# Patient Record
Sex: Male | Born: 1935 | Race: White | Hispanic: No | Marital: Single | Smoking: Never smoker
Health system: Southern US, Community
[De-identification: ages and names within clinical notes are randomized; demographics above are authoritative.]

## PROBLEM LIST (undated history)

## (undated) HISTORY — PX: HERNIA REPAIR: SHX51

---

## 2011-06-17 ENCOUNTER — Encounter (INDEPENDENT_AMBULATORY_CARE_PROVIDER_SITE_OTHER): Payer: Medicare Other | Admitting: Ophthalmology

## 2011-06-17 DIAGNOSIS — H35359 Cystoid macular degeneration, unspecified eye: Secondary | ICD-10-CM

## 2011-06-17 DIAGNOSIS — H3530 Unspecified macular degeneration: Secondary | ICD-10-CM

## 2011-06-17 DIAGNOSIS — H35039 Hypertensive retinopathy, unspecified eye: Secondary | ICD-10-CM

## 2011-06-17 DIAGNOSIS — H43819 Vitreous degeneration, unspecified eye: Secondary | ICD-10-CM

## 2011-08-19 ENCOUNTER — Encounter (INDEPENDENT_AMBULATORY_CARE_PROVIDER_SITE_OTHER): Payer: Medicare Other | Admitting: Ophthalmology

## 2018-04-19 ENCOUNTER — Emergency Department: Payer: Medicare Other

## 2018-04-19 ENCOUNTER — Other Ambulatory Visit: Payer: Self-pay

## 2018-04-19 ENCOUNTER — Emergency Department
Admission: EM | Admit: 2018-04-19 | Discharge: 2018-04-19 | Disposition: A | Discharge status: Home / Self Care | Payer: Medicare Other | Attending: Emergency Medicine | Admitting: Emergency Medicine

## 2018-04-19 DIAGNOSIS — W010XXA Fall on same level from slipping, tripping and stumbling without subsequent striking against object, initial encounter: Secondary | ICD-10-CM | POA: Insufficient documentation

## 2018-04-19 DIAGNOSIS — Y999 Unspecified external cause status: Secondary | ICD-10-CM | POA: Diagnosis not present

## 2018-04-19 DIAGNOSIS — S6992XA Unspecified injury of left wrist, hand and finger(s), initial encounter: Secondary | ICD-10-CM | POA: Diagnosis present

## 2018-04-19 DIAGNOSIS — S63502A Unspecified sprain of left wrist, initial encounter: Secondary | ICD-10-CM | POA: Insufficient documentation

## 2018-04-19 DIAGNOSIS — S0990XA Unspecified injury of head, initial encounter: Secondary | ICD-10-CM | POA: Insufficient documentation

## 2018-04-19 DIAGNOSIS — Y9301 Activity, walking, marching and hiking: Secondary | ICD-10-CM | POA: Diagnosis not present

## 2018-04-19 DIAGNOSIS — W19XXXA Unspecified fall, initial encounter: Secondary | ICD-10-CM

## 2018-04-19 DIAGNOSIS — Y929 Unspecified place or not applicable: Secondary | ICD-10-CM | POA: Insufficient documentation

## 2018-04-19 DIAGNOSIS — S60222A Contusion of left hand, initial encounter: Secondary | ICD-10-CM | POA: Insufficient documentation

## 2018-04-19 LAB — URINALYSIS, COMPLETE (UACMP) WITH MICROSCOPIC
BACTERIA UA: NONE SEEN
BILIRUBIN URINE: NEGATIVE
Glucose, UA: NEGATIVE mg/dL
Hgb urine dipstick: NEGATIVE
KETONES UR: NEGATIVE mg/dL
Leukocytes, UA: NEGATIVE
Nitrite: NEGATIVE
PROTEIN: NEGATIVE mg/dL
Specific Gravity, Urine: 1.024 (ref 1.005–1.030)
pH: 5 (ref 5.0–8.0)

## 2018-04-19 LAB — CBC WITH DIFFERENTIAL/PLATELET
BASOS ABS: 0.1 10*3/uL (ref 0–0.1)
Basophils Relative: 1 %
EOS ABS: 0.1 10*3/uL (ref 0–0.7)
Eosinophils Relative: 1 %
HEMATOCRIT: 46 % (ref 40.0–52.0)
HEMOGLOBIN: 15.8 g/dL (ref 13.0–18.0)
Lymphocytes Relative: 21 %
Lymphs Abs: 2.1 10*3/uL (ref 1.0–3.6)
MCH: 31.1 pg (ref 26.0–34.0)
MCHC: 34.3 g/dL (ref 32.0–36.0)
MCV: 90.7 fL (ref 80.0–100.0)
MONOS PCT: 10 %
Monocytes Absolute: 1 10*3/uL (ref 0.2–1.0)
NEUTROS ABS: 6.6 10*3/uL — AB (ref 1.4–6.5)
NEUTROS PCT: 67 %
Platelets: 232 10*3/uL (ref 150–440)
RBC: 5.07 MIL/uL (ref 4.40–5.90)
RDW: 13.3 % (ref 11.5–14.5)
WBC: 9.8 10*3/uL (ref 3.8–10.6)

## 2018-04-19 LAB — COMPREHENSIVE METABOLIC PANEL
ALBUMIN: 3.8 g/dL (ref 3.5–5.0)
ALK PHOS: 56 U/L (ref 38–126)
ALT: 20 U/L (ref 0–44)
ANION GAP: 9 (ref 5–15)
AST: 25 U/L (ref 15–41)
BUN: 25 mg/dL — AB (ref 8–23)
CO2: 28 mmol/L (ref 22–32)
Calcium: 9.2 mg/dL (ref 8.9–10.3)
Chloride: 101 mmol/L (ref 98–111)
Creatinine, Ser: 1.41 mg/dL — ABNORMAL HIGH (ref 0.61–1.24)
GFR calc Af Amer: 52 mL/min — ABNORMAL LOW (ref >60)
GFR calc non Af Amer: 44 mL/min — ABNORMAL LOW (ref >60)
GLUCOSE: 111 mg/dL — AB (ref 70–99)
POTASSIUM: 3.4 mmol/L — AB (ref 3.5–5.1)
SODIUM: 138 mmol/L (ref 135–145)
Total Bilirubin: 0.9 mg/dL (ref 0.3–1.2)
Total Protein: 8.3 g/dL — ABNORMAL HIGH (ref 6.5–8.1)

## 2018-04-19 MED ORDER — TRAMADOL HCL 50 MG PO TABS: 50.0000 mg | ORAL_TABLET | Freq: Two times a day (BID) | ORAL | 0 refills | Status: AC | PRN

## 2018-04-19 NOTE — Discharge Instructions (Addendum)
Follow[facility]up with the [facility].  Call tomorrow to make arrangements to be seen.  Ice and elevate your hand frequently to reduce swelling.  Wear Ace wrap for support and protection.  You may need to loosen this up if it starts feeling tight.  Tramadol is for pain if you need something for moderate to severe pain.  This medication could cause drowsiness and increase your risk for falling.  Do not take this medication if you plan on walking.  Consider using a walker or a cane when out walking for added support.  Return to the emergency department if any severe worsening of your symptoms.  Continue to hydrate yourself.  Avoid heat and humidity at this time.

## 2018-04-19 NOTE — ED Notes (Signed)
Young male in with pt (caregiver?) states pt fell 2x this week. The 2nd fall he caught himself with his left hand. Now has left hand swelling and pain x 4 days.

## 2018-04-19 NOTE — ED Provider Notes (Signed)
[facility] Emergency Department Provider Note  ____________________________________________   First MD Initiated Contact with Patient 04/19/18 1108     (approximate)  I have reviewed the triage vital signs and the nursing notes.   HISTORY  Chief Complaint Wrist Pain  HPI Dominic Huff is a 84 y.o. male is here with complaint of left hand and wrist pain.  Patient states he fell 4 days ago while walking to the store to purchase bread and water.  He states after he fell he continued walking and again walked back from the store to his home.  Patient lives alone and states that he did not have any food to eat.  He is continued to be ambulatory since that time.  He denies any known loss of consciousness but states that he did hit the left side of his head when he fell.  Patient states that he has tried to stay hydrated because of the hot weather.  He denies any dizziness, headache, chest pain, shortness of breath nausea, vomiting, abdominal pain.  He is brought today by his youngest daughter.  Rates his hand pain as a 9 out of 10.  History reviewed. No pertinent past medical history.  There are no active problems to display for this patient.   Past Surgical History:  Procedure Laterality Date  . HERNIA REPAIR      Prior to Admission medications   Medication Sig Start Date End Date Taking? Authorizing Provider  traMADol (ULTRAM) 50 MG tablet Take 1 tablet (50 mg total) by mouth every 12 (twelve) hours as needed for moderate pain. 04/19/18   Tommi Rumps, PA-C    Allergies Patient has no known allergies.  History reviewed. No pertinent family history.  Social History Social History   Tobacco Use  . Smoking status: Never Smoker  Substance Use Topics  . Alcohol use: Never    Frequency: Never  . Drug use: Not on file    Review of Systems Constitutional: No fever/chills Eyes: No visual changes. ENT: No trauma. Cardiovascular: Denies chest  pain. Respiratory: Denies shortness of breath. Gastrointestinal: No abdominal pain.  No nausea, no vomiting.  No diarrhea.  No constipation. Genitourinary: Negative for dysuria. Musculoskeletal: Negative for back pain.  Positive for left hand and wrist pain. Skin: Negative for rash. Neurological: Negative for headaches, focal weakness or numbness. ____________________________________________   PHYSICAL EXAM:  VITAL SIGNS: ED Triage Vitals  Enc Vitals Group     BP 04/19/18 1033 139/79     Pulse Rate 04/19/18 1033 94     Resp 04/19/18 1033 18     Temp 04/19/18 1033 98.2 F (36.8 C)     Temp Source 04/19/18 1033 Oral     SpO2 04/19/18 1033 98 %     Weight 04/19/18 1040 218 lb (98.9 kg)     Height 04/19/18 1040 6' (1.829 m)     Head Circumference --      Peak Flow --      Pain Score 04/19/18 1039 9     Pain Loc --      Pain Edu? --      Excl. in GC? --     Constitutional: Alert and oriented. Well appearing and in no acute distress.  Patient is very pleasant and talkative. Eyes: Conjunctivae are normal. PERRL. EOMI. Head: Atraumatic. Nose: No congestion/rhinnorhea. Neck: No stridor.  No cervical tenderness on palpation posteriorly.  No evidence of skin abrasion or ecchymosis. Cardiovascular: Normal rate, regular rhythm.  Grossly normal heart sounds.  Good peripheral circulation. Respiratory: Normal respiratory effort.  No retractions. Lungs CTAB. Gastrointestinal: Soft and nontender. No distention.  No CVA tenderness. Musculoskeletal: Examination of left wrist and hand there is moderate soft tissue edema present.  There is diffuse tenderness on palpation dorsal aspect.  Patient is able to move all digits without any difficulty and motor sensory function intact.  Capillary refill is less than 3 seconds.  Skin is intact.  Range of motion of the left wrist is slightly restricted secondary to discomfort.  There is tenderness on palpation of the distal ulna and radius.  Nontender to  palpation forearm and elbow.  Patient denies any pain or discomfort with palpation of the lower extremities including the hip.  Nontender thoracic and lumbar spine to palpation and no evidence of ecchymosis or abrasions seen. Neurologic:  Normal speech and language. No gross focal neurologic deficits are appreciated. No gait instability. Skin:  Skin is warm, dry and intact. No rash noted. Psychiatric: Mood and affect are normal. Speech and behavior are normal.  ____________________________________________   LABS (all labs ordered are listed, but only abnormal results are displayed)  Labs Reviewed  URINALYSIS, COMPLETE (UACMP) WITH MICROSCOPIC - Abnormal; Notable for the following components:      Result Value   Color, Urine AMBER (*)    APPearance HAZY (*)    All other components within normal limits  CBC WITH DIFFERENTIAL/PLATELET - Abnormal; Notable for the following components:   Neutro Abs 6.6 (*)    All other components within normal limits  COMPREHENSIVE METABOLIC PANEL - Abnormal; Notable for the following components:   Potassium 3.4 (*)    Glucose, Bld 111 (*)    BUN 25 (*)    Creatinine, Ser 1.41 (*)    Total Protein 8.3 (*)    GFR calc non Af Amer 44 (*)    GFR calc Af Amer 52 (*)    All other components within normal limits     RADIOLOGY  ED MD interpretation:   X-ray left wrist and hand showed osteoarthritis changes but no acute fracture.  Official radiology report(s): Dg Wrist Complete Left  Result Date: 04/19/2018 CLINICAL DATA:  Fall.  Soft tissue swelling EXAM: LEFT WRIST - COMPLETE 3+ VIEW COMPARISON:  None. FINDINGS: Normal alignment and no fracture. Chondrocalcinosis and mild degenerative change in the wrist joint. Degenerative change in navicular triquetrum joint. No erosion IMPRESSION: Negative for fracture. Triangular fibrocartilage chondrocalcinosis Electronically Signed   By: Marlan Palau M.D.   On: 04/19/2018 12:07   Ct Head Wo Contrast  Result  Date: 04/19/2018 CLINICAL DATA:  S/p fall 4 days ago. EXAM: CT HEAD WITHOUT CONTRAST CT CERVICAL SPINE WITHOUT CONTRAST TECHNIQUE: Multidetector CT imaging of the head and cervical spine was performed following the standard protocol without intravenous contrast. Multiplanar CT image reconstructions of the cervical spine were also generated. COMPARISON:  None FINDINGS: CT HEAD FINDINGS Brain: There are bilateral frontal subdural hygromas, measuring 9 millimeters on the RIGHT and 8 millimeters on the LEFT. These are associated mild mass effect on the frontal lobe gyri bilaterally. No midline shift. No evidence for acute hemorrhage. There is minimal periventricular white matter change. There is no mass lesion. The basilar cisterns and ventricles have a normal appearance. There is no CT evidence for acute infarction or hemorrhage. Vascular: No hyperdense vessel or unexpected calcification. Skull: No acute calvarial injury. There is deformity of the RIGHT mandibular condyle, favored to represent remote injury. Given the lack  of prior studies, is difficult to entirely exclude an acute component superimposed on chronic changes. Sinuses/Orbits: Minimal opacity within the RIGHT ethmoid air cells. Orbits are unremarkable. Mastoid air cells are normally aerated. Other: None CT CERVICAL SPINE FINDINGS Alignment: There is loss of cervical lordosis. This may be secondary to splinting, soft tissue injury, or positioning. Otherwise alignment is normal. Skull base and vertebrae: No acute fracture. No primary bone lesion or focal pathologic process. Soft tissues and spinal canal: No prevertebral fluid or swelling. No visible canal hematoma. Disc levels: There are degenerative changes particularly in the mid cervical spine. Disc height loss particularly at C6-7. Upper chest: Negative. Other: None IMPRESSION: 1. 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 2. No acute hemorrhage. 3. Deformity of  the RIGHT mandibular condyles. Unless the patient has recent injury in this region, the findings are felt to be chronic. 4. Minimal sinus disease. 5. Mid cervical degenerative changes. No evidence for acute cervical spine abnormality. Electronically Signed   By: Norva PavlovElizabeth  Brown M.D.   On: 04/19/2018 13:26   Ct Cervical Spine Wo Contrast  Result Date: 04/19/2018 CLINICAL DATA:  S/p fall 4 days ago. EXAM: CT HEAD WITHOUT CONTRAST CT CERVICAL SPINE WITHOUT CONTRAST TECHNIQUE: Multidetector CT imaging of the head and cervical spine was performed following the standard protocol without intravenous contrast. Multiplanar CT image reconstructions of the cervical spine were also generated. COMPARISON:  None FINDINGS: CT HEAD FINDINGS Brain: There are bilateral frontal subdural hygromas, measuring 9 millimeters on the RIGHT and 8 millimeters on the LEFT. These are associated mild mass effect on the frontal lobe gyri bilaterally. No midline shift. No evidence for acute hemorrhage. There is minimal periventricular white matter change. There is no mass lesion. The basilar cisterns and ventricles have a normal appearance. There is no CT evidence for acute infarction or hemorrhage. Vascular: No hyperdense vessel or unexpected calcification. Skull: No acute calvarial injury. There is deformity of the RIGHT mandibular condyle, favored to represent remote injury. Given the lack of prior studies, is difficult to entirely exclude an acute component superimposed on chronic changes. Sinuses/Orbits: Minimal opacity within the RIGHT ethmoid air cells. Orbits are unremarkable. Mastoid air cells are normally aerated. Other: None CT CERVICAL SPINE FINDINGS Alignment: There is loss of cervical lordosis. This may be secondary to splinting, soft tissue injury, or positioning. Otherwise alignment is normal. Skull base and vertebrae: No acute fracture. No primary bone lesion or focal pathologic process. Soft tissues and spinal canal: No  prevertebral fluid or swelling. No visible canal hematoma. Disc levels: There are degenerative changes particularly in the mid cervical spine. Disc height loss particularly at C6-7. Upper chest: Negative. Other: None IMPRESSION: 1. 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 2. No acute hemorrhage. 3. Deformity of the RIGHT mandibular condyles. Unless the patient has recent injury in this region, the findings are felt to be chronic. 4. Minimal sinus disease. 5. Mid cervical degenerative changes. No evidence for acute cervical spine abnormality. Electronically Signed   By: Norva PavlovElizabeth  Brown M.D.   On: 04/19/2018 13:26   Dg Hand Complete Left  Result Date: 04/19/2018 CLINICAL DATA:  Fall.  Swelling EXAM: LEFT HAND - COMPLETE 3+ VIEW COMPARISON:  None. FINDINGS: Negative for acute fracture.  Dorsal soft tissue swelling. Degenerative change in the wrist joint with chondrocalcinosis. Degenerative change in the navicular triquetrum. No erosion IMPRESSION: Negative for fracture Dorsal soft tissue swelling Degenerative changes  in the wrist with chondrocalcinosis of the triangular fibrocartilage. Electronically Signed   By: Marlan Palau M.D.   On: 04/19/2018 12:06  ____________________________________________   PROCEDURES  Procedure(s) performed: None  Procedures  Critical Care performed: No  ____________________________________________   INITIAL IMPRESSION / ASSESSMENT AND PLAN / ED COURSE  As part of my medical decision making, I reviewed the following data within the electronic MEDICAL RECORD NUMBER Notes from prior ED visits and [place] Controlled Substance Database  Ring was removed from patient's left fourth finger prior to discharge.  Lab findings was discussed with patient and daughter.  Patient currently takes no medication and has no documented health problems.  Patient states that he is seen at the [facility] in [place].  His next appointment is in October.  He  agrees to call and make an earlier appointment for follow-up.  Attempts were made to talk with a neurosurgeon at [facility] however there was no one listed on call for this weekend.  This was discussed with Dr. Lenard Lance.  Patient has remained alert, oriented, talkative and very pleasant.  Daughter acknowledges that the information that he is given is accurate.  Patient is felt to be neurologically intact.  Patient was stable and agrees to follow-up with his doctors at the [place].  A prescription for tramadol was written for 1 every 12 hours if needed for hand and wrist pain.  He is to ice and elevate to reduce swelling.  He is aware that the pain medication could cause drowsiness and increase his risk for falling.  We also discussed using a walker or cane when he is walking.  ____________________________________________   FINAL CLINICAL IMPRESSION(S) / ED DIAGNOSES  Final diagnoses:  Sprain of left wrist, initial encounter  Contusion of left hand, initial encounter  Fall, initial encounter     ED Discharge Orders        Ordered    traMADol (ULTRAM) 50 MG tablet  Every 12 hours PRN     04/19/18 1523       Note:  This document was prepared using Dragon voice recognition software and may include unintentional dictation errors.    Tommi Rumps, PA-C 04/19/18 1829    Jeanmarie Plant, MD 04/24/18 [phone]

## 2018-04-19 NOTE — ED Triage Notes (Signed)
Pt fell 4 days ago and injured L wrist. L hand and wrist are swollen. Able to wiggle fingers. Cap refill less than 3 seconds. Alert, oriented, ambulatory.

## 2019-09-10 IMAGING — CT CT CERVICAL SPINE W/O CM
3 of 6 series · 14 of 33 positions shown, 16 images · non-contrast
Comparison: None

CLINICAL DATA: S/p fall 4 days ago.

EXAM:
CT HEAD WITHOUT CONTRAST
CT CERVICAL SPINE WITHOUT CONTRAST
TECHNIQUE: Multidetector CT imaging of the head and cervical spine was
performed following the standard protocol without intravenous
contrast. Multiplanar CT image reconstructions of the cervical spine
were also generated.

[Series 4: coronal soft tissue · coronal · 0.29mm/px · 3 of 67 slices shown]
[im 17/67  bone]
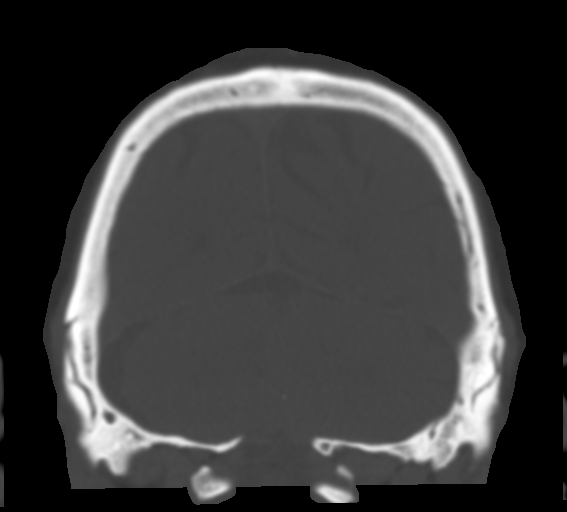
[im 34/67  bone]
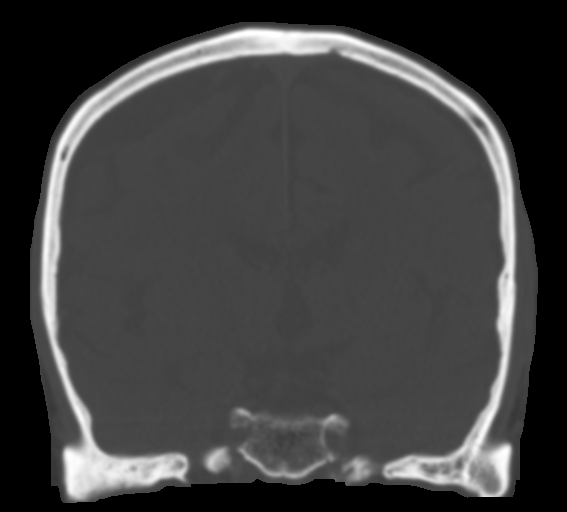
[im 50/67  bone]
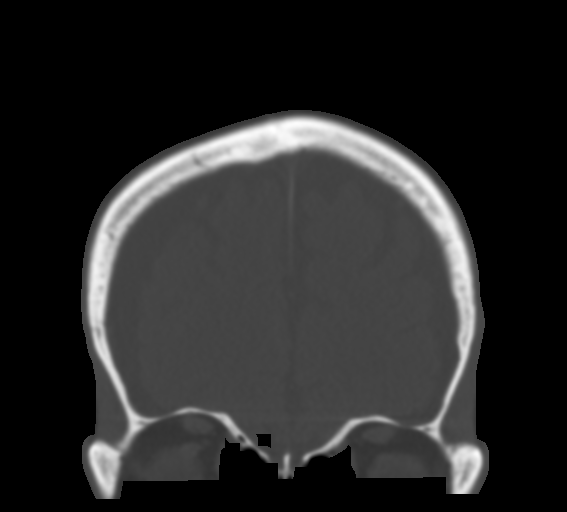

[Series 9: c spine soft · axial · 0.28mm/px · z∈[-363,-217]mm · 6 of 100 slices shown, 8 images]
[im 10/100  soft-tissue]
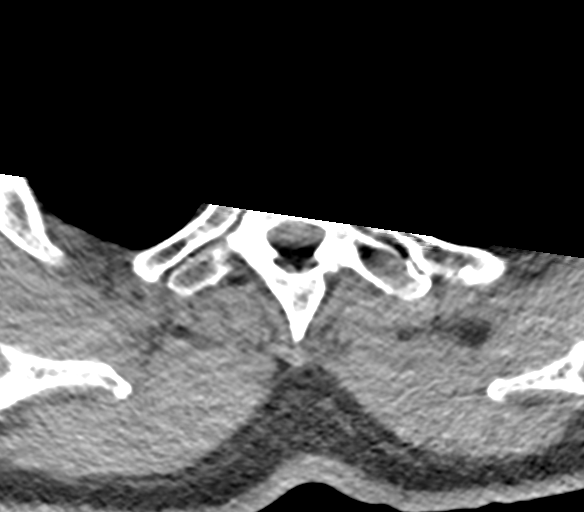
[im 10/100  bone]
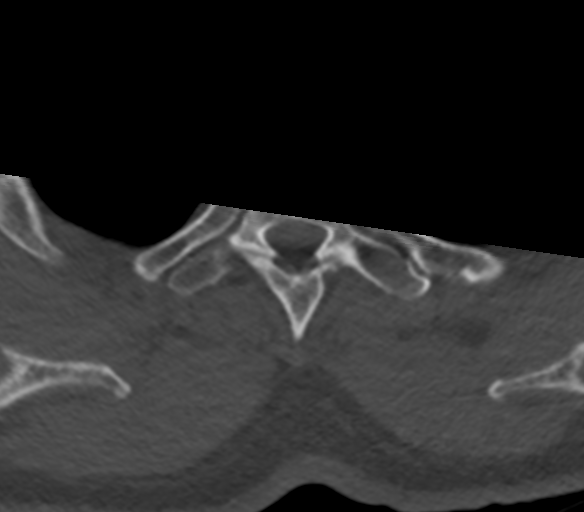
[im 30/100  bone]
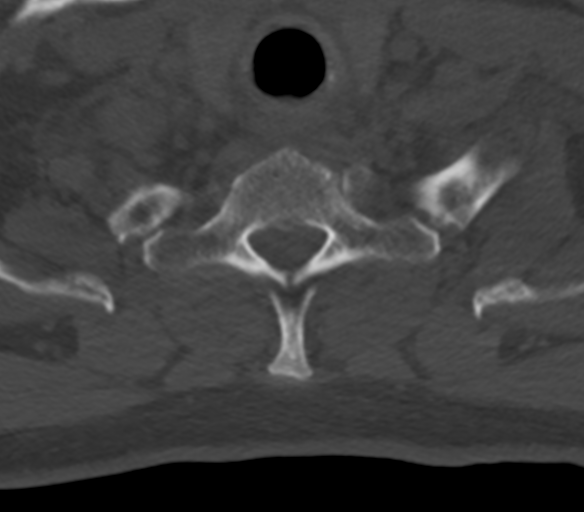
[im 40/100  bone]
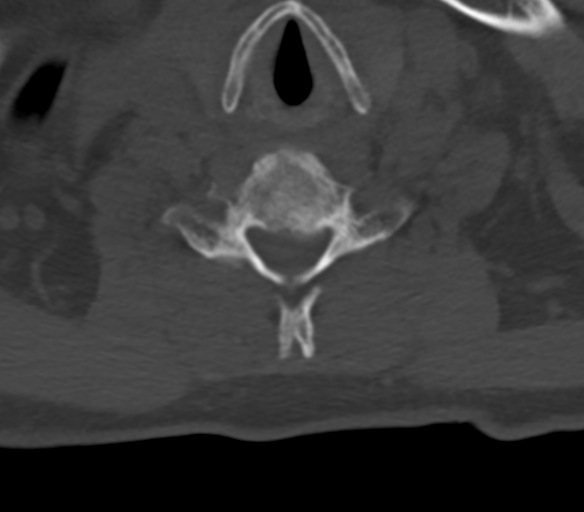
[im 60/100  bone]
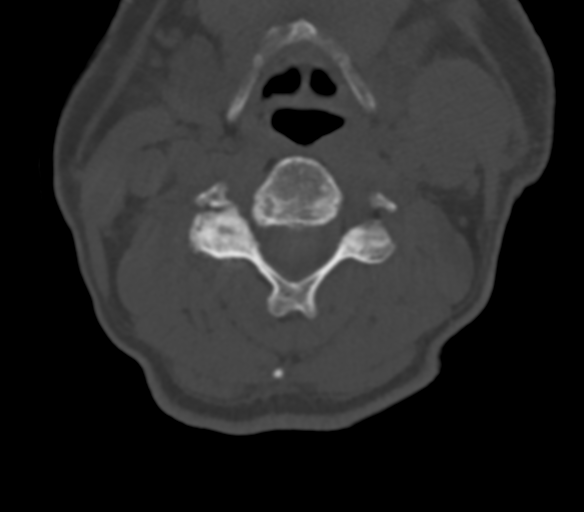
[im 70/100  soft-tissue]
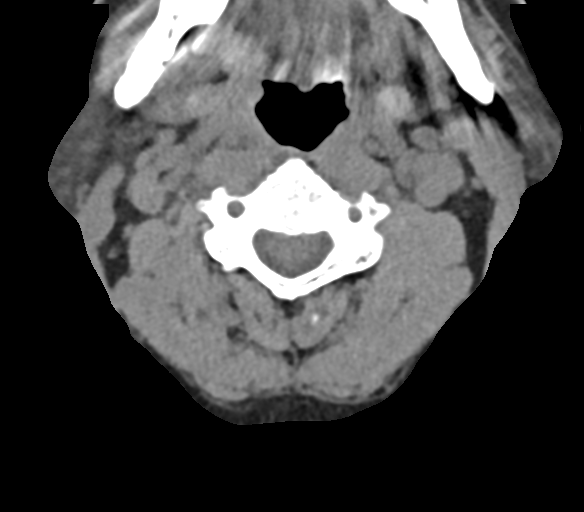
[im 70/100  bone]
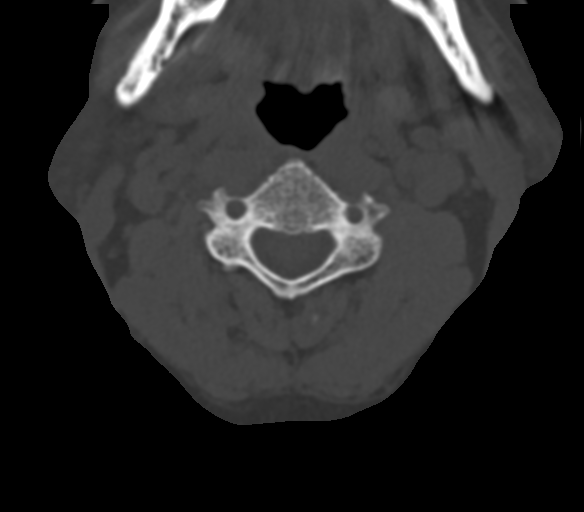
[im 90/100  bone]
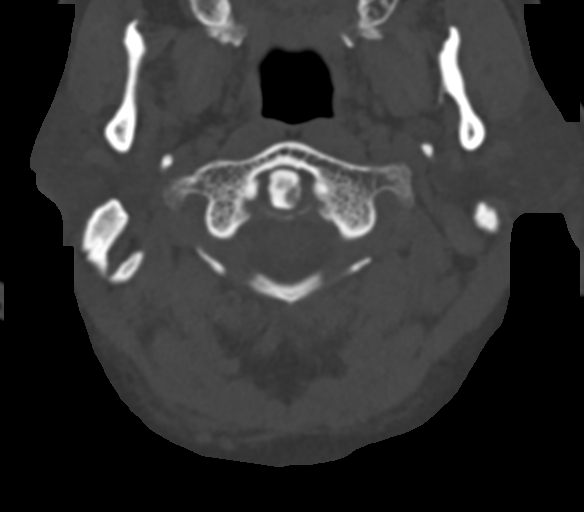

[Series 10: sagittal bone · sagittal · 0.30mm/px · 5 of 67 slices shown]
[im 12/67  bone]
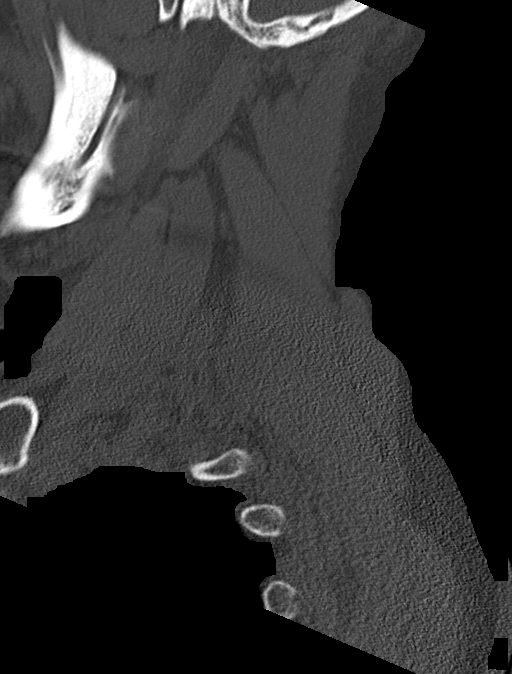
[im 23/67  bone]
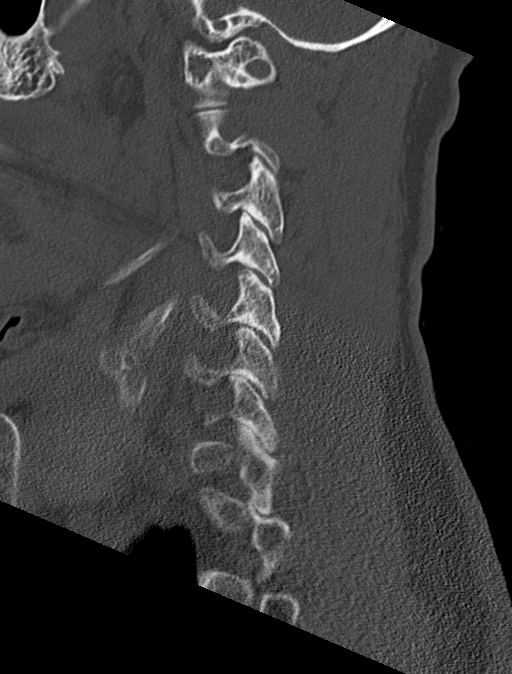
[im 34/67  bone]
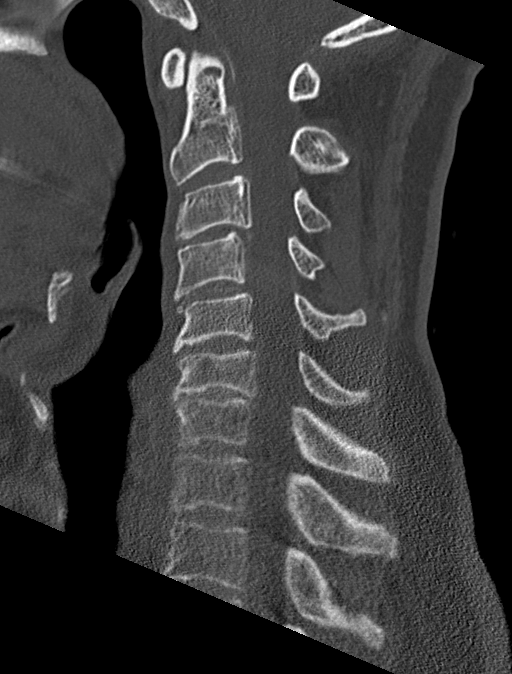
[im 45/67  bone]
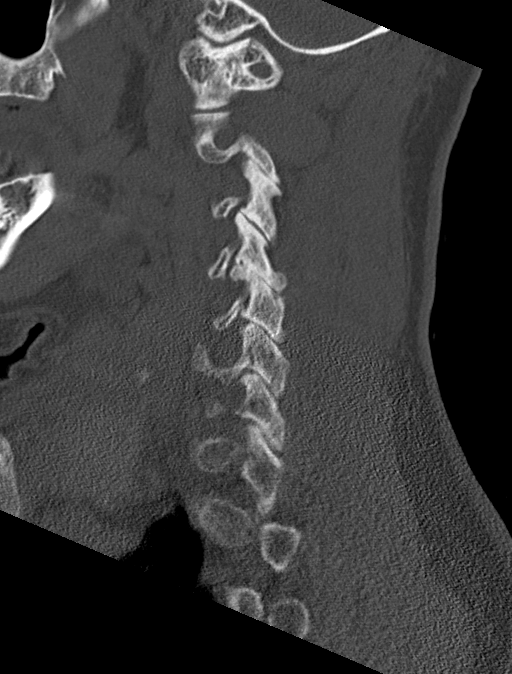
[im 56/67  bone]
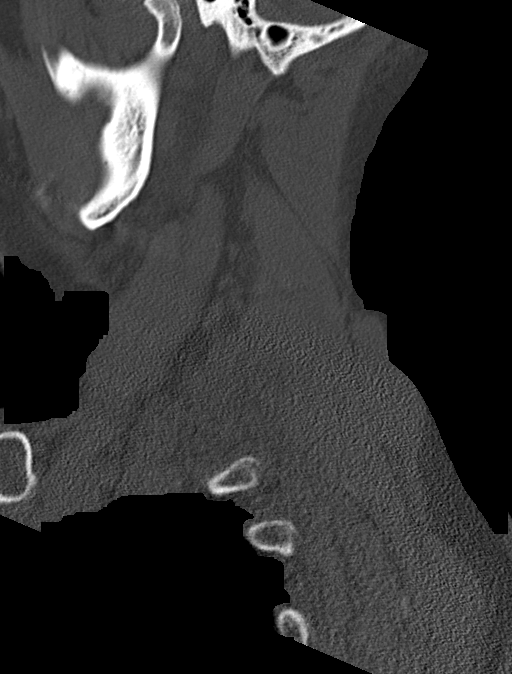

[14 of 33 positions shown; findings below may reference images not displayed]

FINDINGS: CT HEAD FINDINGS

Brain: There are bilateral frontal subdural hygromas, measuring 9
millimeters on the RIGHT and 8 millimeters on the LEFT. These are
associated mild mass effect on the frontal lobe gyri bilaterally. No
midline shift. No evidence for acute hemorrhage. There is minimal
periventricular white matter change. There is no mass lesion. The
basilar cisterns and ventricles have a normal appearance. There is
no CT evidence for acute infarction or hemorrhage.

Vascular: No hyperdense vessel or unexpected calcification.

Skull: No acute calvarial injury.

There is deformity of the RIGHT mandibular condyle, favored to
represent remote injury. Given the lack of prior studies, is
difficult to entirely exclude an acute component superimposed on
chronic changes.

Sinuses/Orbits: Minimal opacity within the RIGHT ethmoid air cells.
Orbits are unremarkable. Mastoid air cells are normally aerated.

Other: None

CT CERVICAL SPINE FINDINGS

Alignment: There is loss of cervical lordosis. This may be secondary
to splinting, soft tissue injury, or positioning. Otherwise
alignment is normal.

Skull base and vertebrae: No acute fracture. No primary bone lesion
or focal pathologic process.

Soft tissues and spinal canal: No prevertebral fluid or swelling. No
visible canal hematoma.

Disc levels: There are degenerative changes particularly in the mid
cervical spine. Disc height loss particularly at C6-7.

Upper chest: Negative.

Other: None
IMPRESSION: 1. Bilateral subdural hygromas with mild mass effect on the gyri of
the frontal lobes.
2. No acute hemorrhage.
3. Deformity of the RIGHT mandibular condyles. Unless the patient
has recent injury in this region, the findings are felt to be
chronic.
4. Minimal sinus disease.
5. Mid cervical degenerative changes. No evidence for acute cervical
spine abnormality.

## 2020-08-07 DEATH — deceased
